# Patient Record
Sex: Female | Born: 1960 | Race: White | Hispanic: No | Marital: Married | Smoking: Never smoker
Health system: Southern US, Community
[De-identification: ages and names within clinical notes are randomized; demographics above are authoritative.]

## PROBLEM LIST (undated history)

## (undated) DIAGNOSIS — E785 Hyperlipidemia, unspecified: Secondary | ICD-10-CM

## (undated) DIAGNOSIS — I10 Essential (primary) hypertension: Secondary | ICD-10-CM

## (undated) DIAGNOSIS — K5732 Diverticulitis of large intestine without perforation or abscess without bleeding: Secondary | ICD-10-CM

## (undated) HISTORY — DX: Essential (primary) hypertension: I10

## (undated) HISTORY — DX: Hyperlipidemia, unspecified: E78.5

## (undated) HISTORY — DX: Diverticulitis of large intestine without perforation or abscess without bleeding: K57.32

## (undated) HISTORY — PX: BRAIN TUMOR EXCISION: SHX577

---

## 2012-07-31 ENCOUNTER — Telehealth (INDEPENDENT_AMBULATORY_CARE_PROVIDER_SITE_OTHER): Payer: Self-pay

## 2012-07-31 ENCOUNTER — Other Ambulatory Visit (INDEPENDENT_AMBULATORY_CARE_PROVIDER_SITE_OTHER): Payer: Self-pay

## 2012-07-31 DIAGNOSIS — K5790 Diverticulosis of intestine, part unspecified, without perforation or abscess without bleeding: Secondary | ICD-10-CM

## 2012-07-31 MED ORDER — AMOXICILLIN-POT CLAVULANATE 875-125 MG PO TABS: 1.0000 | ORAL_TABLET | Freq: Two times a day (BID) | ORAL | Status: AC

## 2012-07-31 NOTE — Telephone Encounter (Signed)
Patient called in saying she is having a flair up from her diverticulosis and wanted to know if Dr. Corliss Skains would prescribe her Augmentin since her prescription she has has expired. . I paged Dr. Corliss Skains and he okayed for me to have it filled. I e-prescribed it to [facility] per patient's request. Patient's husband took down appointment information for her to follow up with Dr.Tsuei on 08/12/12.

## 2012-08-12 ENCOUNTER — Encounter (INDEPENDENT_AMBULATORY_CARE_PROVIDER_SITE_OTHER): Payer: Self-pay | Admitting: Surgery

## 2012-08-12 ENCOUNTER — Ambulatory Visit (INDEPENDENT_AMBULATORY_CARE_PROVIDER_SITE_OTHER): Payer: BC Managed Care – PPO | Admitting: Surgery

## 2012-08-12 VITALS — BP 142/64 | HR 68 | Temp 98.2°F | Resp 16 | Ht 62.0 in | Wt 163.2 lb

## 2012-08-12 DIAGNOSIS — K5732 Diverticulitis of large intestine without perforation or abscess without bleeding: Secondary | ICD-10-CM

## 2012-08-12 HISTORY — DX: Diverticulitis of large intestine without perforation or abscess without bleeding: K57.32

## 2012-08-12 NOTE — Progress Notes (Signed)
Patient ID: Virginia Moran. Virginia Moran, female   DOB: July 30, 1961, 51 y.o.   MRN: [id number]  Chief Complaint  Patient presents with  . Other    reck divert    HPI Virginia Moran. Virginia Moran is a 51 y.o. female.   HPI This is a 51 year old female that was seen last year after her first attack of diverticulitis. In February of 2012 she had severe left lower quadrant abdominal pain and nausea. A CT scan showed diverticulitis of the proximal descending colon with a possible microperforation. Her symptoms improved with IV Cipro and Flagyl. She was referred to me a month later and was asymptomatic. She had a colonoscopy by Dr. Evette Cristal which showed diverticulosis of the descending and sigmoid colon. A few weeks ago the patient began experiencing generalized body aches and low-grade fever up to 99.  She then developed some mild left lower quadrant discomfort. She called our office and we immediately started her on Augmentin. Her symptoms went away within a few days. Her appetite has improved. She continues on a [employer]. Bowel movements are relatively normal with no sign of constipation or diarrhea. She denies any melena.  Past Medical History  Diagnosis Date  . Hyperlipidemia   . Hypertension   . Diverticulitis of sigmoid colon 08/12/2012    Past Surgical History  Procedure Date  . Brain tumor excision     benign    History reviewed. No pertinent family history.  Social History History  Substance Use Topics  . Smoking status: Never Smoker   . Smokeless tobacco: Never Used  . Alcohol Use: No    No Known Allergies  Current Outpatient Prescriptions  Medication Sig Dispense Refill  . amoxicillin-clavulanate (AUGMENTIN) 875-125 MG per tablet Take 1 tablet by mouth 2 (two) times daily.  28 tablet  0    Review of Systems Review of Systems  Constitutional: Negative for fever, chills and unexpected weight change.  HENT: Negative for hearing loss, congestion, sore throat, trouble swallowing and voice change.   Eyes:  Negative for visual disturbance.  Respiratory: Negative for cough and wheezing.   Cardiovascular: Negative for chest pain, palpitations and leg swelling.  Gastrointestinal: Positive for abdominal pain. Negative for nausea, vomiting, diarrhea, constipation, blood in stool, abdominal distention and anal bleeding.  Genitourinary: Negative for hematuria, vaginal bleeding and difficulty urinating.  Musculoskeletal: Positive for arthralgias.  Skin: Negative for rash and wound.  Neurological: Negative for seizures, syncope and headaches.  Hematological: Negative for adenopathy. Does not bruise/bleed easily.  Psychiatric/Behavioral: Negative for confusion.    Blood pressure 142/64, pulse 68, temperature 98.2 F (36.8 C), temperature source Temporal, resp. rate 16, height 5\' 2"  (1.575 m), weight 163 lb 3.2 oz (74.027 kg).  Physical Exam Physical Exam WDWN in NAD HEENT:  EOMI, sclera anicteric Neck:  No masses, no thyromegaly Lungs:  CTA bilaterally; normal respiratory effort CV:  Regular rate and rhythm; no murmurs Abd:  +bowel sounds, soft, non-tender, no masses Ext:  Well-perfused; no edema Skin:  Warm, dry; no sign of jaundice  Data Reviewed none  Assessment    Second mild episode of left-sided diverticulitis    Plan    At this time, I would not recommend colon resection. This episode was quite mild and resolved quickly with oral antibiotics. The patient will resume a regular diet. If she begins to have more symptoms she will call to let us know. If her symptoms become more frequent then we will consider elective left hemicolectomy. However at this time I  do not believe that that surgery is indicated.  Follow-up PRN.        Syrenity Klepacki K. 08/12/2012, 7:36 PM

## 2013-06-26 ENCOUNTER — Telehealth (INDEPENDENT_AMBULATORY_CARE_PROVIDER_SITE_OTHER): Payer: Self-pay | Admitting: *Deleted

## 2013-06-26 NOTE — Telephone Encounter (Signed)
Patient called this morning to report that she believes she is having a flare up of her diverticulitis.  Patient reports low grade fever (99's), generalized body aches, nausea, and lower abdominal pain.  Patient reports she ate a very bland diet yesterday so didn't really notice that eating increased or decreased the abdominal pain.  Patient reports she has been told by Dr. Corliss Skains in the past when she has these symptoms to just call and he would prescribe Augmentin for her.  Explained to patient that Dr. Corliss Skains is out of the office today but would return tomorrow.  Offered patient urgent office to be assessed by an other surgeon since Dr. Fatima Sanger last office visit states that patient would call with symptoms however doesn't state the plan he would initiate, patient declined urgent office and stated she was fine to wait until tomorrow for Dr. Corliss Skains to review and make a decision.  Patient was instructed to give [place] a call back if symptoms change or if she does want to come into urgent office.  Explained that a message will be sent to Dr. Corliss Skains for him to review.  Patient states understanding and agreeable with the plan at this time.

## 2013-06-27 ENCOUNTER — Other Ambulatory Visit (INDEPENDENT_AMBULATORY_CARE_PROVIDER_SITE_OTHER): Payer: Self-pay | Admitting: Surgery

## 2013-06-27 MED ORDER — AMOXICILLIN-POT CLAVULANATE 875-125 MG PO TABS: 1.0000 | ORAL_TABLET | Freq: Two times a day (BID) | ORAL | Status: DC

## 2013-06-27 NOTE — Telephone Encounter (Signed)
Pattricia Boss MA talked to patient this morning and set her up with an appt for 06/30/13 to come in to see Dr. Corliss Skains.  Patient was agreeable with plan.

## 2013-06-30 ENCOUNTER — Ambulatory Visit (INDEPENDENT_AMBULATORY_CARE_PROVIDER_SITE_OTHER): Payer: BC Managed Care – PPO | Admitting: Surgery

## 2013-06-30 ENCOUNTER — Encounter (INDEPENDENT_AMBULATORY_CARE_PROVIDER_SITE_OTHER): Payer: Self-pay | Admitting: Surgery

## 2013-06-30 VITALS — BP 160/96 | HR 68 | Resp 14 | Ht 62.0 in | Wt 162.6 lb

## 2013-06-30 DIAGNOSIS — K5732 Diverticulitis of large intestine without perforation or abscess without bleeding: Secondary | ICD-10-CM

## 2013-06-30 NOTE — Progress Notes (Signed)
Patient ID: Virginia Moran, female   DOB: [birth year], 52 y.o.   MRN: [id number]  Chief Complaint  Patient presents with  . Routine Post Op    reck diverticulitis    HPI Virginia Moran is a 52 y.o. female.  Self-referred for recurrent diverticulitis HPI This is a 52 year old female that was seen in 2012 after her first attack of diverticulitis. In February of 2012 she had severe left lower quadrant abdominal pain and nausea. A CT scan at [facility] showed diverticulitis of the proximal descending colon with a possible microperforation. Her symptoms improved with IV Cipro and Flagyl. She was referred to me a month later and was asymptomatic. She had a colonoscopy by Dr. Evette Cristal which showed diverticulosis of the descending and sigmoid colon. A few weeks ago the patient began experiencing generalized body aches and low-grade fever up to 99. She then developed some mild left lower quadrant discomfort. She called our office and we immediately started her on Augmentin. Her symptoms went away within a few days. Her appetite has improved. She was last seen 10/13.  She had a mild episode of left lower quadrant pain in the spring that went away with a few days of Augmentin.  Last week, she began having some low-grade fevers to 100, mild nausea, and intermittent mild LLQ "discomfort" but no real pain.  We restarted her on Augmentin, and her symptoms have essentially resolved.  She continues to have normal bowel movements with no bleeding, no constipation.  She has been liquids the last few days and started a BRAT diet today. Currently, she is essentially symptom free.  She reports a few episodes over the last year when she woke up in the morning feeling fine, had a cup of coffee, developed acute onset of nausea with vomiting, and then was asymptomatic after that.  She denies any associated abdominal pain.  Past Medical History  Diagnosis Date  . Hyperlipidemia   . Hypertension   . Diverticulitis of sigmoid colon  08/12/2012    Past Surgical History  Procedure Laterality Date  . Brain tumor excision      benign    History reviewed. No pertinent family history.  Social History History  Substance Use Topics  . Smoking status: Never Smoker   . Smokeless tobacco: Never Used  . Alcohol Use: No    No Known Allergies  Current Outpatient Prescriptions  Medication Sig Dispense Refill  . amoxicillin-clavulanate (AUGMENTIN) 250-125 MG per tablet Take 1 tablet by mouth 3 (three) times daily.      . metoprolol succinate (TOPROL-XL) 25 MG 24 hr tablet Take 25 mg by mouth daily.       No current facility-administered medications for this visit.    Review of Systems Review of Systems  Constitutional: Positive for fever. Negative for chills and unexpected weight change.  HENT: Negative for hearing loss, congestion, sore throat, trouble swallowing and voice change.   Eyes: Negative for visual disturbance.  Respiratory: Negative for cough and wheezing.   Cardiovascular: Negative for chest pain, palpitations and leg swelling.  Gastrointestinal: Positive for nausea and abdominal pain. Negative for vomiting, diarrhea, constipation, blood in stool, abdominal distention and anal bleeding.  Genitourinary: Negative for hematuria, vaginal bleeding and difficulty urinating.  Musculoskeletal: Negative for arthralgias.  Skin: Negative for rash and wound.  Neurological: Negative for seizures, syncope and headaches.  Hematological: Negative for adenopathy. Does not bruise/bleed easily.  Psychiatric/Behavioral: Negative for confusion.    Blood pressure 160/96, pulse 68, resp. rate  14, height 5\' 2"  (1.575 m), weight 162 lb 9.6 oz (73.755 kg).  Physical Exam Physical Exam WDWN in NAD HEENT:  EOMI, sclera anicteric Neck:  No masses, no thyromegaly Lungs:  CTA bilaterally; normal respiratory effort CV:  Regular rate and rhythm; no murmurs Abd:  +bowel sounds, soft, non-tender, no masses Ext:  Well-perfused;  no edema Skin:  Warm, dry; no sign of jaundice  Data Reviewed none  Assessment    Sigmoid diverticulitis - mild; responds easily to Augmentin Possible reflux symptoms with regurgitation     Plan    Finish the 10 day course of Augmentin.  If her symptoms recur soon, will repeat CT scan.  She would like to avoid colon resection at this time.    If her regurgitation symptoms worsen, we will check an upper GI to evaluate for GERD/ hiatal hernia.  She will keep [place] posted on her symptoms.         Virginia Moran K. 06/30/2013, 5:06 PM

## 2013-07-22 ENCOUNTER — Encounter (INDEPENDENT_AMBULATORY_CARE_PROVIDER_SITE_OTHER): Payer: Self-pay

## 2015-03-17 ENCOUNTER — Other Ambulatory Visit: Payer: Self-pay | Admitting: Unknown Physician Specialty

## 2015-03-17 DIAGNOSIS — N63 Unspecified lump in unspecified breast: Secondary | ICD-10-CM

## 2015-03-17 DIAGNOSIS — R928 Other abnormal and inconclusive findings on diagnostic imaging of breast: Secondary | ICD-10-CM

## 2015-03-25 ENCOUNTER — Ambulatory Visit
Admission: RE | Admit: 2015-03-25 | Discharge: 2015-03-25 | Disposition: A | Discharge status: Home / Self Care | Payer: 59 | Source: Ambulatory Visit | Attending: Unknown Physician Specialty | Admitting: Unknown Physician Specialty

## 2015-03-25 DIAGNOSIS — R928 Other abnormal and inconclusive findings on diagnostic imaging of breast: Secondary | ICD-10-CM

## 2015-03-25 DIAGNOSIS — N63 Unspecified lump in unspecified breast: Secondary | ICD-10-CM

## 2016-05-05 ENCOUNTER — Other Ambulatory Visit: Payer: Self-pay | Admitting: Unknown Physician Specialty

## 2016-05-05 DIAGNOSIS — Z1231 Encounter for screening mammogram for malignant neoplasm of breast: Secondary | ICD-10-CM

## 2016-05-11 ENCOUNTER — Other Ambulatory Visit: Payer: Self-pay | Admitting: Unknown Physician Specialty

## 2016-05-11 DIAGNOSIS — N6489 Other specified disorders of breast: Secondary | ICD-10-CM

## 2016-05-15 ENCOUNTER — Ambulatory Visit
Admission: RE | Admit: 2016-05-15 | Discharge: 2016-05-15 | Disposition: A | Discharge status: Home / Self Care | Payer: 59 | Source: Ambulatory Visit | Attending: Unknown Physician Specialty | Admitting: Unknown Physician Specialty

## 2016-05-15 DIAGNOSIS — N6489 Other specified disorders of breast: Secondary | ICD-10-CM

## 2016-12-19 ENCOUNTER — Other Ambulatory Visit: Payer: Self-pay | Admitting: Surgery

## 2016-12-19 DIAGNOSIS — Z8719 Personal history of other diseases of the digestive system: Secondary | ICD-10-CM

## 2016-12-28 ENCOUNTER — Ambulatory Visit
Admission: RE | Admit: 2016-12-28 | Discharge: 2016-12-28 | Disposition: A | Discharge status: Home / Self Care | Payer: 59 | Source: Ambulatory Visit | Attending: Surgery | Admitting: Surgery

## 2016-12-28 DIAGNOSIS — Z8719 Personal history of other diseases of the digestive system: Secondary | ICD-10-CM

## 2016-12-28 MED ORDER — IOPAMIDOL (ISOVUE-300) INJECTION 61%
100.0000 mL | Freq: Once | INTRAVENOUS | Status: AC | PRN
  Administered 2016-12-28: 100 mL via INTRAVENOUS

## 2017-02-05 ENCOUNTER — Ambulatory Visit: Payer: Self-pay | Admitting: Surgery

## 2017-05-07 ENCOUNTER — Other Ambulatory Visit: Payer: Self-pay | Admitting: Family Medicine

## 2017-05-07 DIAGNOSIS — Z1231 Encounter for screening mammogram for malignant neoplasm of breast: Secondary | ICD-10-CM

## 2017-05-17 ENCOUNTER — Ambulatory Visit: Payer: 59

## 2017-05-28 ENCOUNTER — Ambulatory Visit
Admission: RE | Admit: 2017-05-28 | Discharge: 2017-05-28 | Disposition: A | Discharge status: Home / Self Care | Payer: 59 | Source: Ambulatory Visit | Attending: Family Medicine | Admitting: Family Medicine

## 2017-05-28 DIAGNOSIS — Z1231 Encounter for screening mammogram for malignant neoplasm of breast: Secondary | ICD-10-CM

## 2018-05-01 ENCOUNTER — Other Ambulatory Visit: Payer: Self-pay | Admitting: Unknown Physician Specialty

## 2018-05-01 DIAGNOSIS — Z1231 Encounter for screening mammogram for malignant neoplasm of breast: Secondary | ICD-10-CM

## 2018-05-29 ENCOUNTER — Ambulatory Visit
Admission: RE | Admit: 2018-05-29 | Discharge: 2018-05-29 | Disposition: A | Discharge status: Home / Self Care | Payer: 59 | Source: Ambulatory Visit | Attending: Unknown Physician Specialty | Admitting: Unknown Physician Specialty

## 2018-05-29 DIAGNOSIS — Z1231 Encounter for screening mammogram for malignant neoplasm of breast: Secondary | ICD-10-CM

## 2018-05-30 ENCOUNTER — Other Ambulatory Visit: Payer: Self-pay | Admitting: Unknown Physician Specialty

## 2018-05-30 DIAGNOSIS — R928 Other abnormal and inconclusive findings on diagnostic imaging of breast: Secondary | ICD-10-CM

## 2018-06-03 ENCOUNTER — Ambulatory Visit
Admission: RE | Admit: 2018-06-03 | Discharge: 2018-06-03 | Disposition: A | Discharge status: Home / Self Care | Payer: 59 | Source: Ambulatory Visit | Attending: Unknown Physician Specialty | Admitting: Unknown Physician Specialty

## 2018-06-03 DIAGNOSIS — R928 Other abnormal and inconclusive findings on diagnostic imaging of breast: Secondary | ICD-10-CM

## 2018-09-30 ENCOUNTER — Other Ambulatory Visit: Payer: Self-pay | Admitting: Surgery

## 2018-09-30 DIAGNOSIS — R1032 Left lower quadrant pain: Secondary | ICD-10-CM

## 2019-06-24 ENCOUNTER — Other Ambulatory Visit: Payer: Self-pay | Admitting: Surgery

## 2019-06-24 ENCOUNTER — Other Ambulatory Visit (HOSPITAL_COMMUNITY): Payer: Self-pay | Admitting: Surgery

## 2019-06-24 ENCOUNTER — Other Ambulatory Visit: Payer: Self-pay

## 2019-06-24 ENCOUNTER — Ambulatory Visit (HOSPITAL_COMMUNITY)
Admission: RE | Admit: 2019-06-24 | Discharge: 2019-06-24 | Disposition: A | Discharge status: Home / Self Care | Payer: POS | Source: Ambulatory Visit | Attending: Surgery | Admitting: Surgery

## 2019-06-24 DIAGNOSIS — K5732 Diverticulitis of large intestine without perforation or abscess without bleeding: Secondary | ICD-10-CM | POA: Diagnosis not present

## 2019-06-24 MED ORDER — IOHEXOL 300 MG/ML  SOLN
100.0000 mL | Freq: Once | INTRAMUSCULAR | Status: AC | PRN
  Administered 2019-06-24: 17:00:00 100 mL via INTRAVENOUS

## 2019-06-24 NOTE — Progress Notes (Signed)
Please call the patient and let them know no sign of perforation or abscess.  Mild diverticulitis.  Change abx to Cipro 500 mg BID and Flagyl 500 TID x 2 weeks

## 2019-07-22 ENCOUNTER — Other Ambulatory Visit: Payer: Self-pay | Admitting: Family Medicine

## 2019-07-22 DIAGNOSIS — Z1231 Encounter for screening mammogram for malignant neoplasm of breast: Secondary | ICD-10-CM

## 2019-07-23 ENCOUNTER — Ambulatory Visit
Admission: RE | Admit: 2019-07-23 | Discharge: 2019-07-23 | Disposition: A | Discharge status: Home / Self Care | Payer: POS | Source: Ambulatory Visit | Attending: Family Medicine | Admitting: Family Medicine

## 2019-07-23 ENCOUNTER — Other Ambulatory Visit: Payer: Self-pay

## 2019-07-23 DIAGNOSIS — Z1231 Encounter for screening mammogram for malignant neoplasm of breast: Secondary | ICD-10-CM

## 2020-01-21 IMAGING — MG MM DIGITAL SCREENING BILAT W/ TOMO W/ CAD
8 series · 8 of 24 positions shown · non-contrast
Comparison: Previous exam(s).

CLINICAL DATA: Screening.

EXAM:
DIGITAL SCREENING BILATERAL MAMMOGRAM WITH TOMO AND CAD

[R MLO synth-2D]
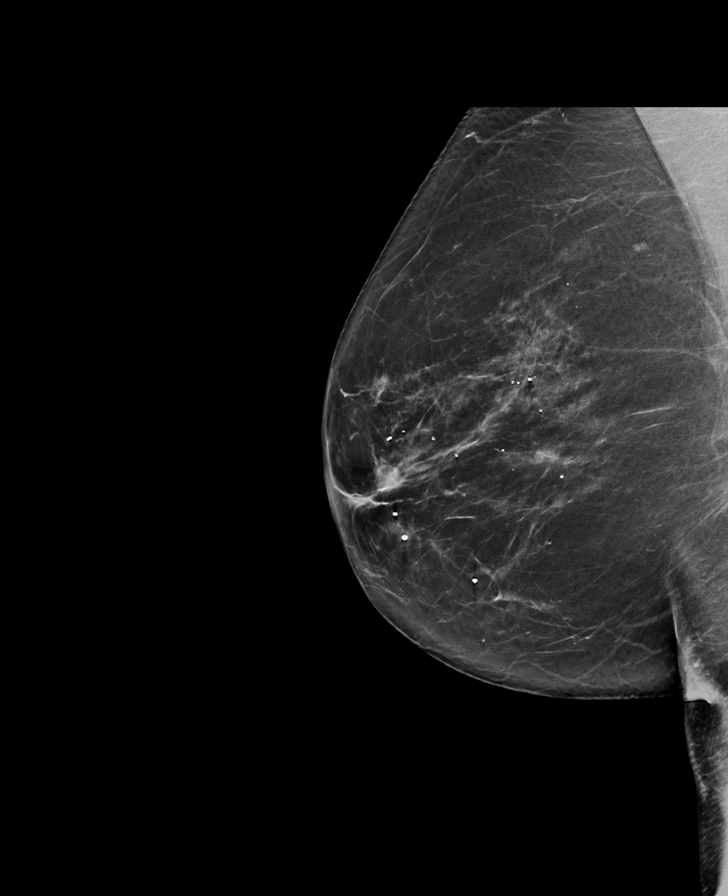

[L CC synth-2D]
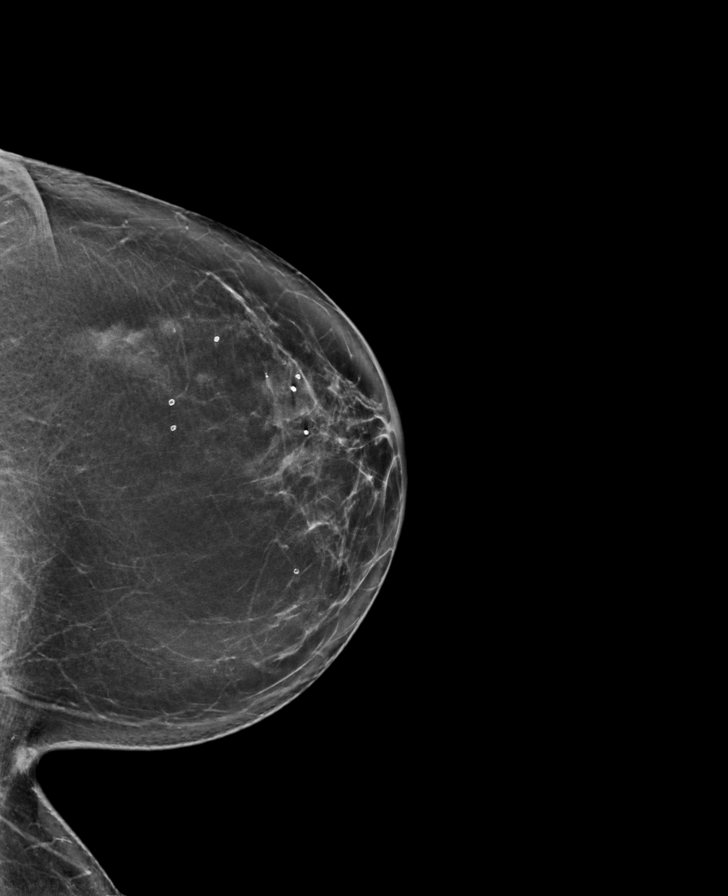

[R CC synth-2D]
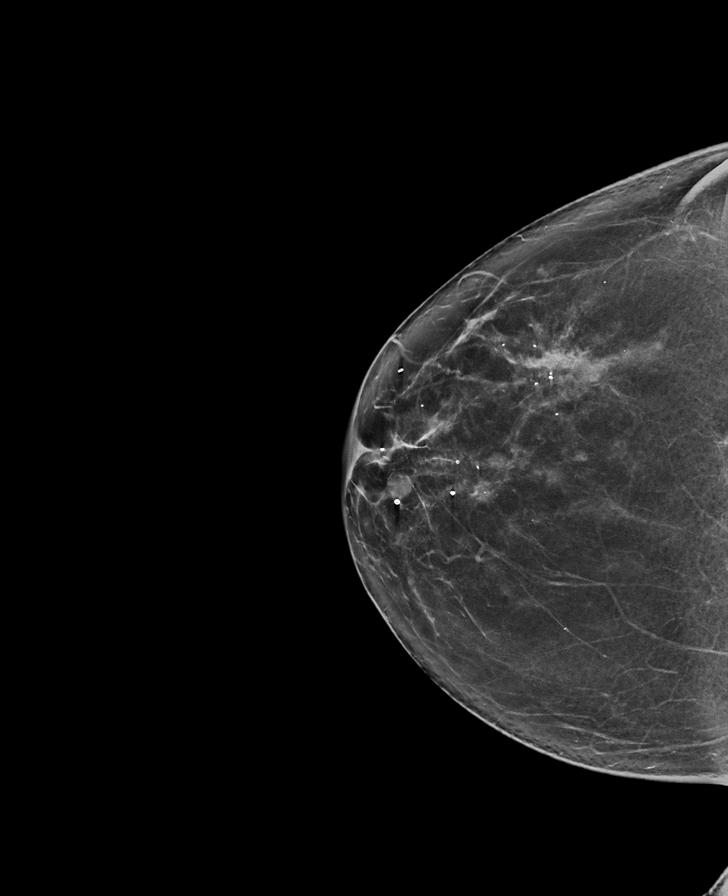

[L MLO synth-2D]
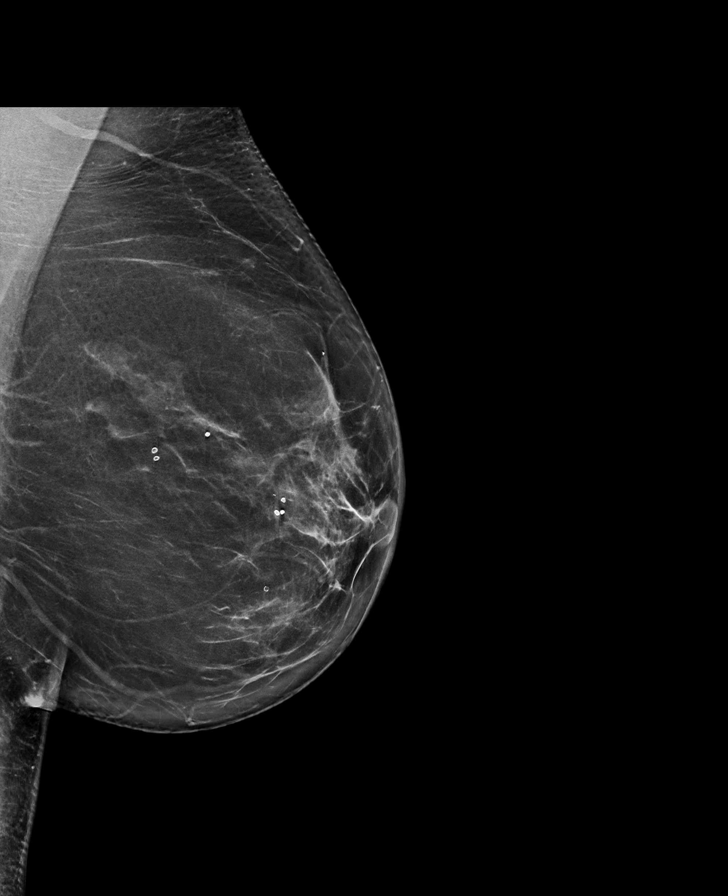

[R CC tomo · tomo slice 37/74.0]
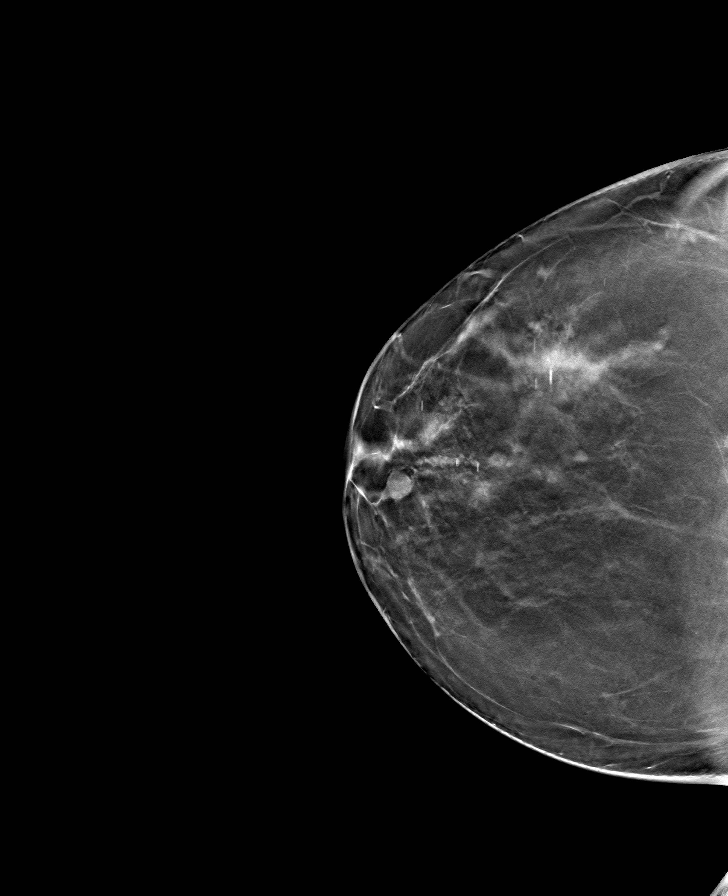

[L CC tomo · tomo slice 39/76.0]
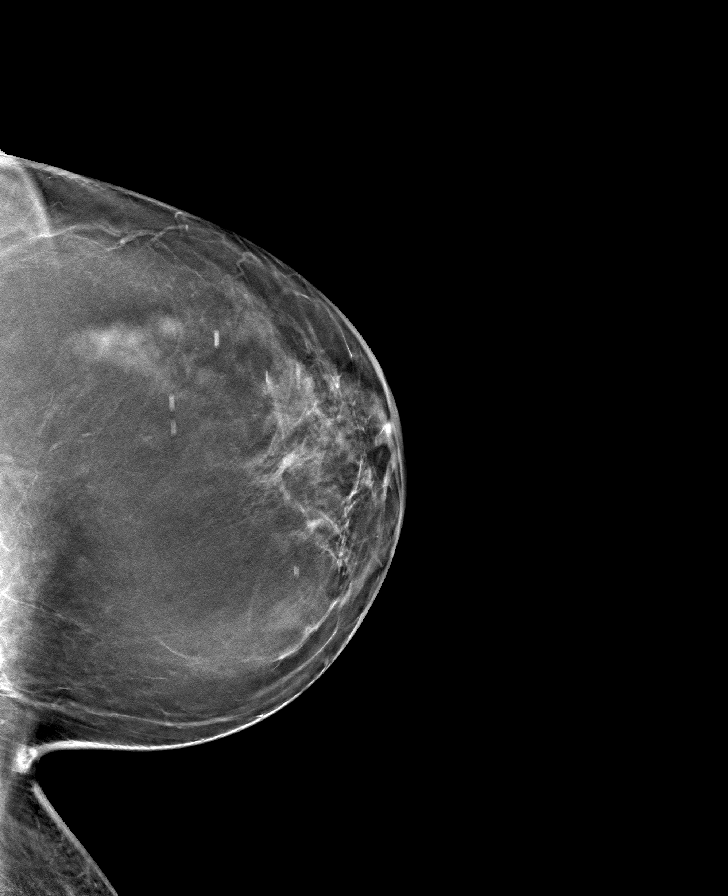

[L MLO tomo · tomo slice 39/78.0]
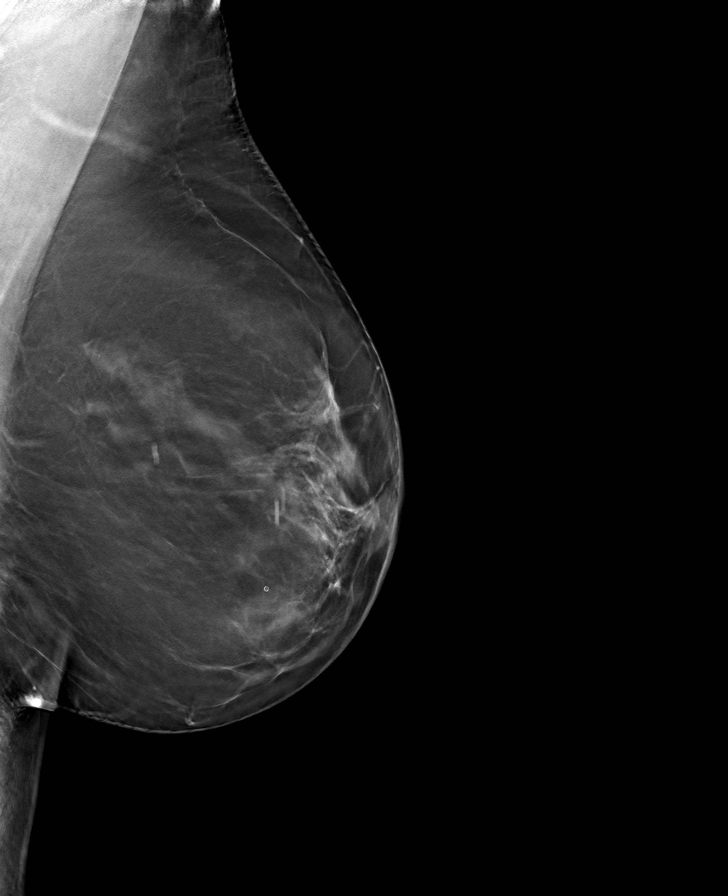

[R MLO tomo · tomo slice 38/75.0]
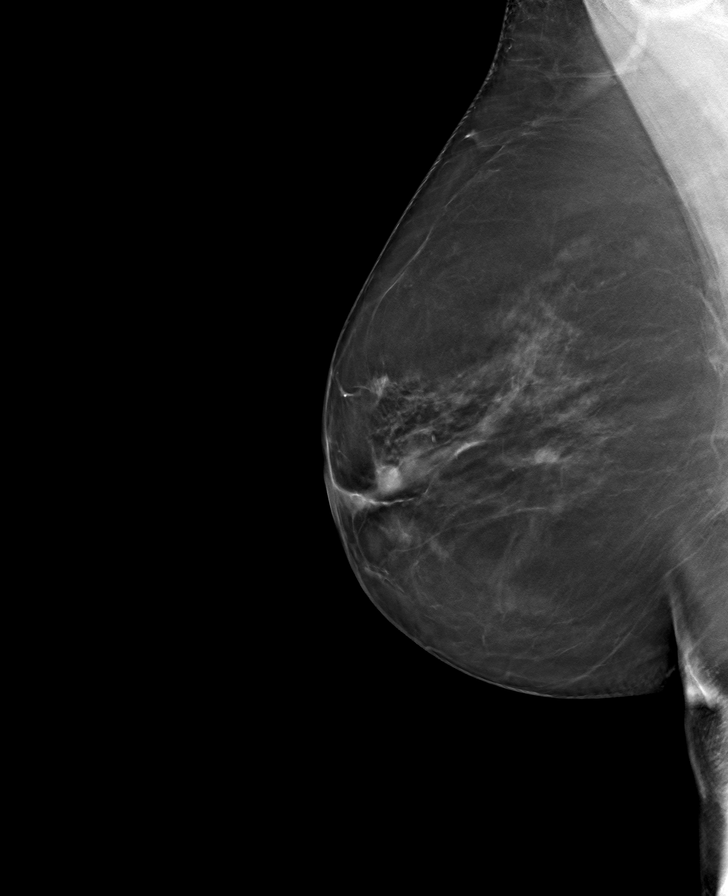

[8 of 24 positions shown; findings below may reference images not displayed]

ACR Breast Density Category b: There are scattered areas of
fibroglandular density.
FINDINGS: There are no findings suspicious for malignancy. Images were
processed with CAD.
IMPRESSION: No mammographic evidence of malignancy. A result letter of this
screening mammogram will be mailed directly to the patient.

RECOMMENDATION:
Screening mammogram in one year. (Code:[id number])

BI-RADS CATEGORY  1: Negative.

## 2020-06-23 ENCOUNTER — Other Ambulatory Visit: Payer: Self-pay | Admitting: Family Medicine

## 2020-06-23 DIAGNOSIS — Z1231 Encounter for screening mammogram for malignant neoplasm of breast: Secondary | ICD-10-CM

## 2020-07-27 ENCOUNTER — Ambulatory Visit: Payer: POS

## 2020-08-19 ENCOUNTER — Ambulatory Visit: Payer: POS

## 2020-10-21 ENCOUNTER — Ambulatory Visit: Payer: POS

## 2020-11-29 ENCOUNTER — Ambulatory Visit: Payer: POS

## 2021-01-13 ENCOUNTER — Other Ambulatory Visit: Payer: Self-pay

## 2021-01-13 ENCOUNTER — Ambulatory Visit
Admission: RE | Admit: 2021-01-13 | Discharge: 2021-01-13 | Disposition: A | Discharge status: Home / Self Care | Payer: POS | Source: Ambulatory Visit | Attending: Family Medicine | Admitting: Family Medicine

## 2021-01-13 DIAGNOSIS — Z1231 Encounter for screening mammogram for malignant neoplasm of breast: Secondary | ICD-10-CM

## 2021-12-05 ENCOUNTER — Other Ambulatory Visit: Payer: Self-pay | Admitting: Family Medicine

## 2021-12-05 DIAGNOSIS — Z1231 Encounter for screening mammogram for malignant neoplasm of breast: Secondary | ICD-10-CM

## 2022-01-25 ENCOUNTER — Ambulatory Visit
Admission: RE | Admit: 2022-01-25 | Discharge: 2022-01-25 | Disposition: A | Discharge status: Home / Self Care | Payer: POS | Source: Ambulatory Visit | Attending: Family Medicine | Admitting: Family Medicine

## 2022-01-25 DIAGNOSIS — Z1231 Encounter for screening mammogram for malignant neoplasm of breast: Secondary | ICD-10-CM

## 2023-01-10 ENCOUNTER — Other Ambulatory Visit: Payer: Self-pay | Admitting: Family Medicine

## 2023-01-10 DIAGNOSIS — Z0000 Encounter for general adult medical examination without abnormal findings: Secondary | ICD-10-CM

## 2023-02-27 ENCOUNTER — Ambulatory Visit
Admission: RE | Admit: 2023-02-27 | Discharge: 2023-02-27 | Disposition: A | Discharge status: Home / Self Care | Payer: 59 | Source: Ambulatory Visit | Attending: Family Medicine | Admitting: Family Medicine

## 2023-02-27 DIAGNOSIS — Z0000 Encounter for general adult medical examination without abnormal findings: Secondary | ICD-10-CM

## 2023-03-01 ENCOUNTER — Other Ambulatory Visit: Payer: Self-pay | Admitting: Family Medicine

## 2023-03-01 DIAGNOSIS — R928 Other abnormal and inconclusive findings on diagnostic imaging of breast: Secondary | ICD-10-CM

## 2023-03-13 ENCOUNTER — Ambulatory Visit: Payer: POS

## 2023-03-13 ENCOUNTER — Ambulatory Visit
Admission: RE | Admit: 2023-03-13 | Discharge: 2023-03-13 | Disposition: A | Discharge status: Home / Self Care | Payer: POS | Source: Ambulatory Visit | Attending: Family Medicine | Admitting: Family Medicine

## 2023-03-13 DIAGNOSIS — R928 Other abnormal and inconclusive findings on diagnostic imaging of breast: Secondary | ICD-10-CM

## 2024-04-18 ENCOUNTER — Other Ambulatory Visit: Payer: Self-pay | Admitting: Family Medicine

## 2024-04-18 DIAGNOSIS — Z1231 Encounter for screening mammogram for malignant neoplasm of breast: Secondary | ICD-10-CM

## 2024-05-13 ENCOUNTER — Ambulatory Visit
Admission: RE | Admit: 2024-05-13 | Discharge: 2024-05-13 | Disposition: A | Discharge status: Home / Self Care | Source: Ambulatory Visit | Attending: Family Medicine | Admitting: Family Medicine

## 2024-05-13 DIAGNOSIS — Z1231 Encounter for screening mammogram for malignant neoplasm of breast: Secondary | ICD-10-CM

## 2024-05-15 ENCOUNTER — Other Ambulatory Visit: Payer: Self-pay | Admitting: Family Medicine

## 2024-05-15 DIAGNOSIS — R928 Other abnormal and inconclusive findings on diagnostic imaging of breast: Secondary | ICD-10-CM

## 2024-05-19 ENCOUNTER — Ambulatory Visit
Admission: RE | Admit: 2024-05-19 | Discharge: 2024-05-19 | Disposition: A | Discharge status: Home / Self Care | Source: Ambulatory Visit | Attending: Family Medicine | Admitting: Family Medicine

## 2024-05-19 ENCOUNTER — Ambulatory Visit
Admission: RE | Admit: 2024-05-19 | Discharge: 2024-05-19 | Disposition: A | Discharge status: Home / Self Care | Source: Ambulatory Visit | Attending: Family Medicine

## 2024-05-19 DIAGNOSIS — R928 Other abnormal and inconclusive findings on diagnostic imaging of breast: Secondary | ICD-10-CM
